# Patient Record
Sex: Male | Born: 1990 | Race: White | Hispanic: No | Marital: Married | Smoking: Never smoker
Health system: Southern US, Community
[De-identification: ages and names within clinical notes are randomized; demographics above are authoritative.]

---

## 2014-09-03 ENCOUNTER — Ambulatory Visit
Admission: RE | Admit: 2014-09-03 | Discharge: 2014-09-03 | Disposition: A | Discharge status: Home / Self Care | Payer: No Typology Code available for payment source | Source: Ambulatory Visit | Attending: Occupational Medicine | Admitting: Occupational Medicine

## 2014-09-03 ENCOUNTER — Other Ambulatory Visit: Payer: Self-pay | Admitting: Occupational Medicine

## 2014-09-03 DIAGNOSIS — Z021 Encounter for pre-employment examination: Secondary | ICD-10-CM

## 2016-06-08 ENCOUNTER — Other Ambulatory Visit: Payer: Self-pay | Admitting: Family Medicine

## 2016-06-09 LAB — HEPATITIS B SURFACE ANTIBODY,QUALITATIVE: Hep B Surface Ab, Qual: REACTIVE

## 2016-06-09 LAB — CMP12+LP+TP+TSH+6AC+CBC/D/PLT
ALBUMIN: 4.6 g/dL (ref 3.5–5.5)
ALK PHOS: 89 IU/L (ref 39–117)
ALT: 40 IU/L (ref 0–44)
AST: 37 IU/L (ref 0–40)
Albumin/Globulin Ratio: 1.8 (ref 1.2–2.2)
BASOS: 0 %
BILIRUBIN TOTAL: 0.7 mg/dL (ref 0.0–1.2)
BUN / CREAT RATIO: 16 (ref 9–20)
BUN: 15 mg/dL (ref 6–20)
Basophils Absolute: 0 10*3/uL (ref 0.0–0.2)
CALCIUM: 9.7 mg/dL (ref 8.7–10.2)
CHLORIDE: 99 mmol/L (ref 96–106)
Chol/HDL Ratio: 3.9 ratio units (ref 0.0–5.0)
Cholesterol, Total: 145 mg/dL (ref 100–199)
Creatinine, Ser: 0.93 mg/dL (ref 0.76–1.27)
EOS (ABSOLUTE): 0.1 10*3/uL (ref 0.0–0.4)
EOS: 2 %
Estimated CHD Risk: 0.7 times avg. (ref 0.0–1.0)
Free Thyroxine Index: 1.9 (ref 1.2–4.9)
GFR calc Af Amer: 132 mL/min/{1.73_m2} (ref >59)
GFR calc non Af Amer: 114 mL/min/{1.73_m2} (ref >59)
GGT: 46 IU/L (ref 0–65)
GLUCOSE: 69 mg/dL (ref 65–99)
Globulin, Total: 2.5 g/dL (ref 1.5–4.5)
HDL: 37 mg/dL — ABNORMAL LOW (ref >39)
HEMATOCRIT: 46.5 % (ref 37.5–51.0)
HEMOGLOBIN: 15.5 g/dL (ref 12.6–17.7)
IMMATURE GRANS (ABS): 0 10*3/uL (ref 0.0–0.1)
IMMATURE GRANULOCYTES: 0 %
Iron: 150 ug/dL (ref 38–169)
LDH: 158 IU/L (ref 121–224)
LDL CALC: 63 mg/dL (ref 0–99)
LYMPHS ABS: 1.7 10*3/uL (ref 0.7–3.1)
Lymphs: 33 %
MCH: 31 pg (ref 26.6–33.0)
MCHC: 33.3 g/dL (ref 31.5–35.7)
MCV: 93 fL (ref 79–97)
Monocytes Absolute: 0.4 10*3/uL (ref 0.1–0.9)
Monocytes: 7 %
NEUTROS ABS: 3 10*3/uL (ref 1.4–7.0)
NEUTROS PCT: 58 %
POTASSIUM: 4.5 mmol/L (ref 3.5–5.2)
Phosphorus: 3.1 mg/dL (ref 2.5–4.5)
Platelets: 229 10*3/uL (ref 150–379)
RBC: 5 x10E6/uL (ref 4.14–5.80)
RDW: 14 % (ref 12.3–15.4)
Sodium: 142 mmol/L (ref 134–144)
T3 Uptake Ratio: 31 % (ref 24–39)
T4, Total: 6 ug/dL (ref 4.5–12.0)
TSH: 1.1 u[IU]/mL (ref 0.450–4.500)
Total Protein: 7.1 g/dL (ref 6.0–8.5)
Triglycerides: 223 mg/dL — ABNORMAL HIGH (ref 0–149)
Uric Acid: 5.4 mg/dL (ref 3.7–8.6)
VLDL CHOLESTEROL CAL: 45 mg/dL — AB (ref 5–40)
WBC: 5.2 10*3/uL (ref 3.4–10.8)

## 2016-10-11 ENCOUNTER — Encounter: Payer: Self-pay | Admitting: Physician Assistant

## 2016-10-11 ENCOUNTER — Ambulatory Visit: Payer: Self-pay | Admitting: Physician Assistant

## 2016-10-11 VITALS — BP 140/67 | HR 97 | Temp 98.6°F

## 2016-10-11 DIAGNOSIS — R300 Dysuria: Secondary | ICD-10-CM

## 2016-10-11 DIAGNOSIS — Z299 Encounter for prophylactic measures, unspecified: Secondary | ICD-10-CM

## 2016-10-11 LAB — POCT URINALYSIS DIPSTICK
BILIRUBIN UA: NEGATIVE
GLUCOSE UA: NEGATIVE
Ketones, UA: NEGATIVE
Nitrite, UA: NEGATIVE
PH UA: 5.5
RBC UA: NEGATIVE
Spec Grav, UA: >=1.03
UROBILINOGEN UA: 0.2

## 2016-10-11 NOTE — Progress Notes (Signed)
S: c/o dysuria and ?abd swelling in lower abs, ?if hernia, no pain with lifting or bm, had regular bm today, no blood in stool, no fever/chills, did have some dysuria, denies unprotected sex, states he is married and has no concerns of infidelity, denies penile discharge  O: vitals wnl, nad, abd soft nontender, no obvious swelling noted, no weakness in muscle noted, n/v intact, ua with trace leuks + protein  A: dysuria  P: urine culture

## 2016-10-12 LAB — URINE CULTURE: Organism ID, Bacteria: NO GROWTH

## 2016-10-14 ENCOUNTER — Other Ambulatory Visit: Payer: Self-pay | Admitting: Family Medicine

## 2016-10-14 DIAGNOSIS — R1032 Left lower quadrant pain: Secondary | ICD-10-CM

## 2016-10-15 ENCOUNTER — Ambulatory Visit
Admission: RE | Admit: 2016-10-15 | Discharge: 2016-10-15 | Disposition: A | Discharge status: Home / Self Care | Payer: Managed Care, Other (non HMO) | Source: Ambulatory Visit | Attending: Family Medicine | Admitting: Family Medicine

## 2016-10-15 DIAGNOSIS — R1032 Left lower quadrant pain: Secondary | ICD-10-CM | POA: Diagnosis not present

## 2017-02-17 ENCOUNTER — Ambulatory Visit: Payer: Self-pay | Admitting: Physician Assistant

## 2017-02-17 ENCOUNTER — Encounter: Payer: Self-pay | Admitting: Physician Assistant

## 2017-02-17 VITALS — BP 110/70 | HR 92 | Temp 98.7°F

## 2017-02-17 DIAGNOSIS — N433 Hydrocele, unspecified: Secondary | ICD-10-CM

## 2017-02-17 NOTE — Progress Notes (Signed)
   Subjective:testicular pain    Patient ID: Kyle Acevedo, male    DOB: 1991/09/05, 26 y.o.   MRN: [id number]  HPI Patient c/ inferior scrotum pain for 2 weeks. Seen by Urgent Care clinic and pending UltraSound exam next week. Request 2nd opinion. States swelling increased with sexual activity and physical training. Denies groin pain. No penile discharge.   Review of Systems    Negative except for compliant. Objective:   Physical Exam Distended vascular structure lower scrotum       Assessment & Plan:Hydrocele  Advised to follow up with schedule Ultra Sound exam for definitve diagnosis.

## 2017-02-26 ENCOUNTER — Emergency Department (HOSPITAL_COMMUNITY)
Admission: EM | Admit: 2017-02-26 | Discharge: 2017-02-26 | Disposition: A | Discharge status: Home / Self Care | Payer: Managed Care, Other (non HMO) | Attending: Emergency Medicine | Admitting: Emergency Medicine

## 2017-02-26 ENCOUNTER — Encounter (HOSPITAL_COMMUNITY): Payer: Self-pay | Admitting: Emergency Medicine

## 2017-02-26 DIAGNOSIS — E860 Dehydration: Secondary | ICD-10-CM | POA: Insufficient documentation

## 2017-02-26 DIAGNOSIS — R509 Fever, unspecified: Secondary | ICD-10-CM

## 2017-02-26 DIAGNOSIS — R112 Nausea with vomiting, unspecified: Secondary | ICD-10-CM

## 2017-02-26 DIAGNOSIS — R197 Diarrhea, unspecified: Secondary | ICD-10-CM

## 2017-02-26 LAB — COMPREHENSIVE METABOLIC PANEL
ALBUMIN: 4.6 g/dL (ref 3.5–5.0)
ALT: 63 U/L (ref 17–63)
AST: 59 U/L — ABNORMAL HIGH (ref 15–41)
Alkaline Phosphatase: 76 U/L (ref 38–126)
Anion gap: 7 (ref 5–15)
BUN: 21 mg/dL — AB (ref 6–20)
CHLORIDE: 106 mmol/L (ref 101–111)
CO2: 26 mmol/L (ref 22–32)
CREATININE: 1.14 mg/dL (ref 0.61–1.24)
Calcium: 9.4 mg/dL (ref 8.9–10.3)
GFR calc Af Amer: >60 mL/min (ref >60)
GFR calc non Af Amer: >60 mL/min (ref >60)
Glucose, Bld: 134 mg/dL — ABNORMAL HIGH (ref 65–99)
POTASSIUM: 4.8 mmol/L (ref 3.5–5.1)
SODIUM: 139 mmol/L (ref 135–145)
Total Bilirubin: 2 mg/dL — ABNORMAL HIGH (ref 0.3–1.2)
Total Protein: 7.3 g/dL (ref 6.5–8.1)

## 2017-02-26 LAB — CBC
HCT: 45.9 % (ref 39.0–52.0)
Hemoglobin: 16 g/dL (ref 13.0–17.0)
MCH: 31.5 pg (ref 26.0–34.0)
MCHC: 34.9 g/dL (ref 30.0–36.0)
MCV: 90.4 fL (ref 78.0–100.0)
PLATELETS: 181 10*3/uL (ref 150–400)
RBC: 5.08 MIL/uL (ref 4.22–5.81)
RDW: 12.1 % (ref 11.5–15.5)
WBC: 14.3 10*3/uL — AB (ref 4.0–10.5)

## 2017-02-26 MED ORDER — ONDANSETRON 8 MG PO TBDP: 8.0000 mg | ORAL_TABLET | Freq: Three times a day (TID) | ORAL | 0 refills | Status: DC | PRN

## 2017-02-26 MED ORDER — ONDANSETRON HCL 4 MG/2ML IJ SOLN
4.0000 mg | Freq: Once | INTRAMUSCULAR | Status: AC
  Administered 2017-02-26: 4 mg via INTRAVENOUS
  Filled 2017-02-26: qty 2

## 2017-02-26 MED ORDER — SODIUM CHLORIDE 0.9 % IV BOLUS (SEPSIS)
1000.0000 mL | Freq: Once | INTRAVENOUS | Status: AC
  Administered 2017-02-26: 1000 mL via INTRAVENOUS

## 2017-02-26 MED ORDER — ACETAMINOPHEN 500 MG PO TABS
1000.0000 mg | ORAL_TABLET | Freq: Once | ORAL | Status: AC
  Administered 2017-02-26: 1000 mg via ORAL
  Filled 2017-02-26: qty 2

## 2017-02-26 NOTE — ED Triage Notes (Signed)
Reports n/v/d since 0800 this morning.

## 2017-02-26 NOTE — ED Triage Notes (Signed)
N/V/D since this am at 0800 No physician No flu shot

## 2017-02-26 NOTE — ED Provider Notes (Signed)
[facility]PT Provider Note   CSN: [id number] Arrival date & time: 02/26/17  1144  By signing my name below, I, Majel Homer, attest that this documentation has been prepared under the direction and in the presence of Azalia Bilis, MD . Electronically Signed: Majel Homer, Scribe. 02/26/2017. 12:10 PM.  History   Chief Complaint Chief Complaint  Patient presents with  . Abdominal Pain   The history is provided by the patient. No language interpreter was used.   HPI Comments: Kyle Acevedo is a 26 y.o. male who presents to the Emergency Department complaining of persistent, nausea, vomiting, and diarrhea that began at ~8:00 AM this morning. Pt reports associated fever and chills. He notes he has not taken any medication PTA to improve his symptoms. Pt denies cough, congestion, sand sore throat.   History reviewed. No pertinent past medical history.  There are no active problems to display for this patient.  History reviewed. No pertinent surgical history.  Home Medications    Prior to Admission medications   Not on File    Family History History reviewed. No pertinent family history.  Social History Social History  Substance Use Topics  . Smoking status: Never Smoker  . Smokeless tobacco: Never Used  . Alcohol use No     Allergies   Patient has no known allergies.   Review of Systems Review of Systems  HENT: Negative for congestion and sore throat.   Respiratory: Negative for cough.   Gastrointestinal: Positive for diarrhea, nausea and vomiting.  All other systems reviewed and are negative.  Physical Exam Updated Vital Signs BP (!) 103/38 (BP Location: Left Arm)   Pulse 88   Temp 98.4 F (36.9 C) (Oral)   Resp 18   Ht 5\' 9"  (1.753 m)   Wt 195 lb (88.5 kg)   SpO2 100%   BMI 28.80 kg/m   Physical Exam  Constitutional: He is oriented to person, place, and time. He appears well-developed and well-nourished.  HENT:  Head: Normocephalic and atraumatic.    Eyes: EOM are normal.  Neck: Normal range of motion.  Cardiovascular: Normal rate, regular rhythm, normal heart sounds and intact distal pulses.   Pulmonary/Chest: Effort normal and breath sounds normal. No respiratory distress.  Abdominal: Soft. He exhibits no distension. There is no tenderness.  Musculoskeletal: Normal range of motion.  Neurological: He is alert and oriented to person, place, and time.  Skin: Skin is warm and dry.  Psychiatric: He has a normal mood and affect. Judgment normal.  Nursing note and vitals reviewed.  ED Treatments / Results  DIAGNOSTIC STUDIES:  Oxygen Saturation is 100% on RA, normal by my interpretation.    COORDINATION OF CARE:  12:21 PM Discussed treatment plan with pt at bedside and pt agreed to plan.  Labs (all labs ordered are listed, but only abnormal results are displayed) Labs Reviewed - No data to display  EKG  EKG Interpretation None       Radiology No results found.  Procedures Procedures (including critical care time)  Medications Ordered in ED Medications - No data to display  Initial Impression / Assessment and Plan / ED Course  I have reviewed the triage vital signs and the nursing notes.  Pertinent labs & imaging results that were available during my care of the patient were reviewed by me and considered in my medical decision making (see chart for details).     1:55 PM Patient feels much better this time.  Repeat abdominal exam without  tenderness.  Likely viral illness.  Feels better after fluids.  Discharge home in good condition.  Home with Zofran.  The patient understands to return to the ER for new or worsening symptoms  I personally performed the services described in this documentation, which was scribed in my presence. The recorded information has been reviewed and is accurate.      Final Clinical Impressions(s) / ED Diagnoses   Final diagnoses:  Nausea vomiting and diarrhea  Dehydration  Fever,  unspecified fever cause    New Prescriptions New Prescriptions   No medications on file     Azalia BilisKevin Darryll Raju, MD 02/26/17 1356

## 2017-02-26 NOTE — ED Notes (Signed)
Pt made aware to return if symptoms worsen or if any life threatening symptoms occur.   

## 2017-03-03 ENCOUNTER — Encounter: Payer: Self-pay | Admitting: Urology

## 2017-03-03 ENCOUNTER — Ambulatory Visit: Payer: Managed Care, Other (non HMO) | Admitting: Urology

## 2017-03-03 VITALS — BP 149/63 | HR 79 | Ht 69.0 in | Wt 194.4 lb

## 2017-03-03 DIAGNOSIS — I861 Scrotal varices: Secondary | ICD-10-CM | POA: Diagnosis not present

## 2017-03-03 DIAGNOSIS — N5082 Scrotal pain: Secondary | ICD-10-CM

## 2017-03-03 LAB — URINALYSIS, COMPLETE
BILIRUBIN UA: NEGATIVE
GLUCOSE, UA: NEGATIVE
KETONES UA: NEGATIVE
Leukocytes, UA: NEGATIVE
Nitrite, UA: NEGATIVE
PROTEIN UA: NEGATIVE
RBC, UA: NEGATIVE
Specific Gravity, UA: 1.015 (ref 1.005–1.030)
UUROB: 0.2 mg/dL (ref 0.2–1.0)
pH, UA: 5.5 (ref 5.0–7.5)

## 2017-03-03 NOTE — Progress Notes (Signed)
03/03/2017 2:25 PM   Kyle Acevedo 20-Nov-1991 [id number]  Referring provider: No referring provider defined for this encounter.  Chief Complaint  Patient presents with  . New Patient (Initial Visit)    Lump on Left Testicle referred by Urgent Care    HPI: Patient is an 26 year old Caucasian male who is referred from [facility] for a testicle mass.  A week ago he felt a mass in his left testicle.  He states it feels like a jumbled bunch of worms that he can separate the "worms."   He is not having any scrotal swelling or edema.  He is not having pain or redness in the scrotal area.    He is married and only sexually active with his wife.    He has not had any urinary symptoms.  He has not any difficulty with erections.    He has not had gross hematuria, dysuria or suprapubic pain.  He has not had fevers, chills, nausea or vomiting.  His UA is unremarkable.    PMH: No past medical history on file.  Surgical History: History reviewed. No pertinent surgical history.  Home Medications:  Allergies as of 03/03/2017   No Known Allergies     Medication List       Accurate as of 03/03/17  2:25 PM. Always use your most recent med list.          ondansetron 8 MG disintegrating tablet Commonly known as:  ZOFRAN ODT Take 1 tablet (8 mg total) by mouth every 8 (eight) hours as needed for nausea or vomiting.       Allergies: No Known Allergies  Family History: Family History  Problem Relation Age of Onset  . Prostate cancer Neg Hx   . Kidney cancer Neg Hx   . Bladder Cancer Neg Hx     Social History:  reports that he has never smoked. He has never used smokeless tobacco. He reports that he does not drink alcohol or use drugs.  ROS: UROLOGY Frequent Urination?: No Hard to postpone urination?: No Burning/pain with urination?: No Get up at night to urinate?: No Leakage of urine?: No Urine stream starts and stops?: No Trouble starting stream?: No Do you have to  strain to urinate?: No Blood in urine?: No Urinary tract infection?: No Sexually transmitted disease?: No Injury to kidneys or bladder?: No Painful intercourse?: No Weak stream?: No Erection problems?: No Penile pain?: No  Gastrointestinal Nausea?: No Vomiting?: No Indigestion/heartburn?: No Diarrhea?: No Constipation?: No  Constitutional Fever: No Night sweats?: No Weight loss?: No Fatigue?: No  Skin Skin rash/lesions?: No Itching?: No  Eyes Blurred vision?: No Double vision?: No  Ears/Nose/Throat Sore throat?: No Sinus problems?: No  Hematologic/Lymphatic Swollen glands?: No Easy bruising?: No  Cardiovascular Leg swelling?: No Chest pain?: No  Respiratory Cough?: No Shortness of breath?: No  Endocrine Excessive thirst?: No  Musculoskeletal Back pain?: No Joint pain?: No  Neurological Headaches?: No Dizziness?: No  Psychologic Depression?: No Anxiety?: No  Physical Exam: BP (!) 149/63   Pulse 79   Ht 5\' 9"  (1.753 m)   Wt 194 lb 6.4 oz (88.2 kg)   BMI 28.71 kg/m   Constitutional: Well nourished. Alert and oriented, No acute distress. HEENT: [place] AT, moist mucus membranes. Trachea midline, no masses. Cardiovascular: No clubbing, cyanosis, or edema. Respiratory: Normal respiratory effort, no increased work of breathing. GI: Abdomen is soft, non tender, non distended, no abdominal masses. Liver and spleen not palpable.  No hernias appreciated.  Stool sample for occult testing is not indicated.   GU: No CVA tenderness.  No bladder fullness or masses.  Patient with circumcised phallus.  Urethral meatus is patent.  No penile discharge. No penile lesions or rashes. Scrotum without lesions, cysts, rashes and/or edema.  Testicles are located scrotally bilaterally. No masses are appreciated in the testicles. Left and right epididymis are normal.  Small left varicocele is noted.   Rectal: Deferred. Skin: No rashes, bruises or suspicious  lesions. Lymph: No cervical or inguinal adenopathy. Neurologic: Grossly intact, no focal deficits, moving all 4 extremities. Psychiatric: Normal mood and affect.  Laboratory Data: Lab Results  Component Value Date   WBC 14.3 (H) 02/26/2017   HGB 16.0 02/26/2017   HCT 45.9 02/26/2017   MCV 90.4 02/26/2017   PLT 181 02/26/2017    Lab Results  Component Value Date   CREATININE 1.14 02/26/2017     Lab Results  Component Value Date   TSH 1.100 06/08/2016       Component Value Date/Time   CHOL 145 06/08/2016 0000   HDL 37 (L) 06/08/2016 0000   CHOLHDL 3.9 06/08/2016 0000   LDLCALC 63 06/08/2016 0000    Lab Results  Component Value Date   AST 59 (H) 02/26/2017   Lab Results  Component Value Date   ALT 63 02/26/2017     Urinalysis Unremarkable.  See EPIC   Assessment & Plan:    1. Left varicocele   - patient is married and I advised that varicoceles may affect fertility   - will obtain a scrotal ultrasound for confirmation  - will call with results  2. Scrotal pain  - see above  Return for I will call patient with results.  These notes generated with voice recognition software. I apologize for typographical errors.  Michiel CowboySHANNON Conleigh Heinlein, PA-C  [facility] [address], [address] [place], [place] [address] [phone]

## 2017-03-04 LAB — GC/CHLAMYDIA PROBE AMP
Chlamydia trachomatis, NAA: NEGATIVE
NEISSERIA GONORRHOEAE BY PCR: NEGATIVE

## 2017-03-06 LAB — CULTURE, URINE COMPREHENSIVE

## 2017-05-13 ENCOUNTER — Other Ambulatory Visit: Payer: Self-pay

## 2017-05-13 VITALS — BP 125/80 | HR 61 | Temp 98.5°F | Ht 69.0 in | Wt 197.0 lb

## 2017-05-13 DIAGNOSIS — Z0189 Encounter for other specified special examinations: Principal | ICD-10-CM

## 2017-05-13 DIAGNOSIS — Z008 Encounter for other general examination: Secondary | ICD-10-CM

## 2017-05-13 NOTE — Progress Notes (Signed)
Patient came in to have blood drawn for testing and [facility]. Patient is scheduled to come back on June 7th for his physical.

## 2017-05-14 LAB — HCV COMMENT:

## 2017-05-14 LAB — CMP12+LP+TP+TSH+6AC+PSA+CBC…
A/G RATIO: 2.1 (ref 1.2–2.2)
ALBUMIN: 4.7 g/dL (ref 3.5–5.5)
ALT: 44 IU/L (ref 0–44)
AST: 52 IU/L — ABNORMAL HIGH (ref 0–40)
Alkaline Phosphatase: 97 IU/L (ref 39–117)
BUN/Creatinine Ratio: 17 (ref 9–20)
BUN: 20 mg/dL (ref 6–20)
Basophils Absolute: 0 10*3/uL (ref 0.0–0.2)
Basos: 0 %
Bilirubin Total: 1.2 mg/dL (ref 0.0–1.2)
CHOLESTEROL TOTAL: 125 mg/dL (ref 100–199)
CREATININE: 1.16 mg/dL (ref 0.76–1.27)
Calcium: 10 mg/dL (ref 8.7–10.2)
Chloride: 101 mmol/L (ref 96–106)
Chol/HDL Ratio: 3.3 ratio (ref 0.0–5.0)
EOS (ABSOLUTE): 0.4 10*3/uL (ref 0.0–0.4)
Eos: 4 %
Estimated CHD Risk: <0.5 times avg. (ref 0.0–1.0)
Free Thyroxine Index: 2.1 (ref 1.2–4.9)
GFR calc Af Amer: 101 mL/min/{1.73_m2} (ref >59)
GFR, EST NON AFRICAN AMERICAN: 87 mL/min/{1.73_m2} (ref >59)
GGT: 18 IU/L (ref 0–65)
GLOBULIN, TOTAL: 2.2 g/dL (ref 1.5–4.5)
Glucose: 87 mg/dL (ref 65–99)
HDL: 38 mg/dL — ABNORMAL LOW (ref >39)
Hematocrit: 43.1 % (ref 37.5–51.0)
Hemoglobin: 14.5 g/dL (ref 13.0–17.7)
IRON: 131 ug/dL (ref 38–169)
Immature Grans (Abs): 0 10*3/uL (ref 0.0–0.1)
Immature Granulocytes: 0 %
LDH: 229 IU/L — ABNORMAL HIGH (ref 121–224)
LDL Calculated: 65 mg/dL (ref 0–99)
LYMPHS: 23 %
Lymphocytes Absolute: 2 10*3/uL (ref 0.7–3.1)
MCH: 30.7 pg (ref 26.6–33.0)
MCHC: 33.6 g/dL (ref 31.5–35.7)
MCV: 91 fL (ref 79–97)
MONOS ABS: 0.5 10*3/uL (ref 0.1–0.9)
Monocytes: 5 %
NEUTROS ABS: 6.1 10*3/uL (ref 1.4–7.0)
Neutrophils: 68 %
PHOSPHORUS: 3.4 mg/dL (ref 2.5–4.5)
POTASSIUM: 4.7 mmol/L (ref 3.5–5.2)
Platelets: 264 10*3/uL (ref 150–379)
Prostate Specific Ag, Serum: 0.3 ng/mL (ref 0.0–4.0)
RBC: 4.72 x10E6/uL (ref 4.14–5.80)
RDW: 13.6 % (ref 12.3–15.4)
Sodium: 142 mmol/L (ref 134–144)
T3 UPTAKE RATIO: 28 % (ref 24–39)
T4 TOTAL: 7.6 ug/dL (ref 4.5–12.0)
TOTAL PROTEIN: 6.9 g/dL (ref 6.0–8.5)
TSH: 1.64 u[IU]/mL (ref 0.450–4.500)
Triglycerides: 109 mg/dL (ref 0–149)
Uric Acid: 5.5 mg/dL (ref 3.7–8.6)
VLDL Cholesterol Cal: 22 mg/dL (ref 5–40)
WBC: 9 10*3/uL (ref 3.4–10.8)

## 2017-05-14 LAB — TESTOSTERONE: Testosterone: 431 ng/dL (ref 264–916)

## 2017-05-14 LAB — HIV ANTIBODY (ROUTINE TESTING W REFLEX): HIV SCREEN 4TH GENERATION: NONREACTIVE

## 2017-05-14 LAB — HEPATITIS C ANTIBODY (REFLEX): HCV Ab: <0.1 s/co ratio (ref 0.0–0.9)

## 2017-05-14 LAB — VITAMIN D 25 HYDROXY (VIT D DEFICIENCY, FRACTURES): Vit D, 25-Hydroxy: 52.2 ng/mL (ref 30.0–100.0)

## 2017-05-26 ENCOUNTER — Encounter: Payer: Self-pay | Admitting: Physician Assistant

## 2017-05-26 ENCOUNTER — Ambulatory Visit: Payer: Self-pay | Admitting: Physician Assistant

## 2017-05-26 VITALS — BP 110/70 | HR 86 | Temp 98.5°F | Resp 16 | Ht 69.0 in | Wt 197.0 lb

## 2017-05-26 DIAGNOSIS — Z0000 Encounter for general adult medical examination without abnormal findings: Secondary | ICD-10-CM

## 2017-05-26 NOTE — Progress Notes (Signed)
   Subjective:Physical Exam    Patient ID: Kyle Acevedo, male    DOB: 08/24/1991, 26 y.o.   MRN: [id number]  HPI Present for annual physical. Voices no compliant.   Review of Systems Negative    Objective:   Physical Exam HEENT unremarkable. Neck supple w/o adenopathy. Lungs CTA and Heart RRR. Abdomen w/o HSM, normoactive BS, and SNTP. No spinal deformity. Free and equal range of motion of the cervical and lumbar spine. No upper/ lower extremity deformity. F/E ROM with strength 5/5. CNII-XII grossly intact.       Assessment & Plan:Well exam  Follow up as needed.

## 2017-06-10 ENCOUNTER — Other Ambulatory Visit: Payer: Self-pay

## 2018-05-16 ENCOUNTER — Encounter: Payer: Self-pay | Admitting: Urology

## 2018-09-26 ENCOUNTER — Encounter: Payer: Self-pay | Admitting: Emergency Medicine

## 2018-09-26 ENCOUNTER — Ambulatory Visit: Payer: Self-pay | Admitting: Emergency Medicine

## 2018-09-26 VITALS — BP 122/74 | HR 72 | Temp 98.4°F | Resp 12 | Ht 70.0 in | Wt 198.0 lb

## 2018-09-26 DIAGNOSIS — S76212A Strain of adductor muscle, fascia and tendon of left thigh, initial encounter: Secondary | ICD-10-CM

## 2018-09-26 DIAGNOSIS — R1032 Left lower quadrant pain: Secondary | ICD-10-CM

## 2018-09-26 NOTE — Progress Notes (Signed)
   Subjective:    Patient ID: Kyle Acevedo, male    DOB: 1991/04/08, 27 y.o.   MRN: [id number]  HPI Complains of "pulling feeling" left inguinal area for 4 days.  He feels it started with excessive weight lifting over the past 2 weeks.  The pulling feeling is mild and he does not characterize it as pain.  Has not noted any deformity or bulge.  Never had a hernia, request to be examined.  I note in chart that he had some similar symptoms in 2017 with normal ultrasound of left inguinal area 10/05/2016.  Also, saw a urologist at [facility]rology 2018 without any pathologic diagnosis. He denies any other abdominal or GU symptoms and otherwise feels well. Associated symptoms, no fever or chills or abdominal pain or nausea or vomiting or diarrhea or melena or blood in stool or change in bowel habits.  Denies dysuria urinary frequency or hematuria testicular pain or testicular mass.  Denies urethral discharge.  Review of Systems See above.  Review of systems otherwise negative    Objective:   Physical Exam  Pleasant, muscular male no distress.  BP 122/74.  Pulse 72.  Temp 98.4, respirations 12 Lungs: Equal expansion Abdomen: Soft, nontender, no organomegaly or hernia or mass. GU: He points to the left inguinal area where he has had the pulling sensation.  No deformity or tenderness or hernia or bulge seen or felt.  Testicles smooth without nodularity or abnormality.  No scrotal masses felt.  Inguinal canals normal without any hernias.  Exam was nontender.  No lesions seen or felt. Extremities: Within normal limits      Assessment & Plan:  Likely has left inguinal muscle strain without any clinical evidence of hernia on exam today. We discussed at length today.  Options discussed.  He declined ordering any imaging at this time.  He will decrease weight lifting and crosstraining for the next see if this resolves.  Discussed other measures such as heat and massage.   If not better within the next  couple weeks, recheck, or I gave him the option of calling us and we can order an ultrasound of the inguinal GU area. Precautions discussed. Red flags discussed. Questions invited and answered. Patient voiced understanding and agreement.

## 2018-10-03 ENCOUNTER — Other Ambulatory Visit: Payer: Self-pay

## 2018-10-03 ENCOUNTER — Other Ambulatory Visit: Payer: Self-pay | Admitting: Emergency Medicine

## 2018-10-03 ENCOUNTER — Telehealth: Payer: Self-pay

## 2018-10-03 DIAGNOSIS — R1032 Left lower quadrant pain: Secondary | ICD-10-CM

## 2018-10-03 NOTE — Telephone Encounter (Addendum)
Yes, per discussion with patient at office visit here 09/26/2018, please schedule ultrasound left inguinal area for persistent left inguinal pain.-We called and scheduled appointment for ultrasound 10/10/2018, at 3 PM.

## 2018-10-03 NOTE — Addendum Note (Signed)
Addended by: Dollene Primrose on: 10/03/2018 02:21 PM   Modules accepted: Orders

## 2018-10-05 ENCOUNTER — Other Ambulatory Visit: Payer: Self-pay | Admitting: Physician Assistant

## 2018-10-05 DIAGNOSIS — N50819 Testicular pain, unspecified: Secondary | ICD-10-CM

## 2018-10-06 ENCOUNTER — Ambulatory Visit
Admission: RE | Admit: 2018-10-06 | Discharge: 2018-10-06 | Disposition: A | Discharge status: Home / Self Care | Payer: Managed Care, Other (non HMO) | Source: Ambulatory Visit | Attending: Physician Assistant | Admitting: Physician Assistant

## 2018-10-06 DIAGNOSIS — N5089 Other specified disorders of the male genital organs: Secondary | ICD-10-CM | POA: Insufficient documentation

## 2018-10-06 DIAGNOSIS — N390 Urinary tract infection, site not specified: Secondary | ICD-10-CM | POA: Insufficient documentation

## 2018-10-06 DIAGNOSIS — I861 Scrotal varices: Secondary | ICD-10-CM | POA: Insufficient documentation

## 2018-10-06 DIAGNOSIS — R1030 Lower abdominal pain, unspecified: Secondary | ICD-10-CM | POA: Diagnosis not present

## 2018-10-06 DIAGNOSIS — N50819 Testicular pain, unspecified: Secondary | ICD-10-CM

## 2018-10-09 ENCOUNTER — Ambulatory Visit: Payer: Managed Care, Other (non HMO)

## 2018-10-10 ENCOUNTER — Other Ambulatory Visit: Payer: Self-pay

## 2019-01-25 ENCOUNTER — Other Ambulatory Visit: Payer: Self-pay

## 2019-01-25 ENCOUNTER — Ambulatory Visit: Payer: Self-pay | Admitting: Emergency Medicine

## 2019-01-25 ENCOUNTER — Encounter: Payer: Self-pay | Admitting: Emergency Medicine

## 2019-01-25 VITALS — BP 118/70 | HR 62 | Temp 98.3°F | Resp 14

## 2019-01-25 DIAGNOSIS — S39011A Strain of muscle, fascia and tendon of abdomen, initial encounter: Secondary | ICD-10-CM

## 2019-01-25 DIAGNOSIS — S39013D Strain of muscle, fascia and tendon of pelvis, subsequent encounter: Secondary | ICD-10-CM

## 2019-01-25 NOTE — Progress Notes (Signed)
[facility]   Patient ID: Kyle Acevedo DOB: 28 y.o. MRN: [id number]   Subjective: HPI:  Vague complaints of 3 weeks of intermittent sharp right inguinal/right lower quadrant muscular wall pain worse with exercising.  No radiation.  He has had these symptoms recurrently over the years and was seen here with normal exam October 2019, with normal ultrasound scrotum with Doppler and normal soft tissue ultrasound of inguinal region 10/06/2018. Denies any GI symptoms whatsoever.  No other abdominal pain.  No nausea, vomiting, diarrhea, change of bowel habits, bright red blood per rectum, melena.  His symptoms are unaffected by eating.  Denies any urinary symptoms.  No dysuria or hematuria.  No GU sores or rash.  No fever or chills. Normal appetite.  No weight change.  Energy level good.  No chest pain or shortness of breath.  Pertinent items noted in HPI and remainder of comprehensive ROS otherwise negative.   Objective: Blood pressure 118/70, pulse 62, temperature 98.3 F (36.8 C), temperature source Oral, resp. rate 14, SpO2 98 %.  Alert, pleasant male no distress. Lungs clear.  Heart regular rhythm Back, nontender without deformity.  No CVA tenderness. Abdomen: Soft, nontender except minimal tenderness right inguinal and right lower quadrant abdominal wall muscles, worse with tensing these muscles or doing a sit up.  No guarding or rebound.  Bowel sounds are normal.     Palpation: There is no fluid wave, hepatomegaly, mass or pulsatile mass.     Tenderness: There is no right CVA tenderness, left CVA tenderness, guarding or rebound. Negative signs include Murphy's sign, Rovsing's sign, McBurney's sign, psoas sign and obturator sign.     Hernia: No hernia is present. There is no hernia in the ventral area, right inguinal area, left inguinal area, right femoral area or left femoral area.    GU: Testicles normal without masses or lesions or tenderness.   No swelling or erythema.  No skin lesions.  Penis normal.  No inguinal hernia.   Assessment: Recurrent muscular strain right lower abdomen without evidence of GU abnormality or hernia or any intra-abdominal cause. Has had negative imaging, see above in history.   Plan:  Treatment options discussed, as well as risks, benefits, alternatives. Patient voiced understanding and agreement with the following plans: Advised not to do aggressive exercising or weight lifting for now and to do very mild crosstraining. Heat alternate with ice. Ibuprofen OTC.  He declined any prescription NSAID. I explained to him at length that because this is a recurrent problem, he needs to address this further with his PCP. He stated he does not have a PCP and I urged him to schedule an appointment today to get established and for a complete wellness exam with a PCP.  I explained risks of him not doing so.  He voiced understanding.  Precautions discussed. Red flags discussed. Questions invited and answered. Patient voiced understanding and agreement with above.

## 2019-02-02 ENCOUNTER — Ambulatory Visit: Payer: Self-pay | Admitting: Adult Health

## 2019-02-02 VITALS — BP 110/60 | HR 66 | Resp 14 | Ht 69.0 in | Wt 197.0 lb

## 2019-02-02 DIAGNOSIS — N433 Hydrocele, unspecified: Secondary | ICD-10-CM | POA: Insufficient documentation

## 2019-02-02 DIAGNOSIS — Z008 Encounter for other general examination: Secondary | ICD-10-CM

## 2019-02-02 DIAGNOSIS — Z0189 Encounter for other specified special examinations: Principal | ICD-10-CM

## 2019-02-02 DIAGNOSIS — S39011A Strain of muscle, fascia and tendon of abdomen, initial encounter: Secondary | ICD-10-CM | POA: Insufficient documentation

## 2019-02-02 NOTE — Progress Notes (Signed)
[facility]  Subjective:     Patient ID: Kyle Acevedo, male   DOB: February 12, 1991, 28 y.o.   MRN: [id number]  HPI   Blood pressure 110/60, pulse 66, resp. rate 14, height 5\' 9"  (1.753 m), weight 197 lb (89.4 kg), SpO2 99 %.  Patient is a  28 year old male   in no acute distress who comes to the clinic for a biometric screening with brief biometric screening with labs include fasting glucose and lipids.    He reports his abdomen pain has resolved since seeing Dr. Georgina Pillion last in this clinic.   Patient  denies any fever, body aches,chills, rash, chest pain, shortness of breath, nausea, vomiting, or diarrhea.     No Known Allergies  .   Review of Systems  Constitutional: Negative.   HENT: Negative.   Eyes: Negative.   Respiratory: Negative.   Cardiovascular: Negative.   Gastrointestinal: Negative.   Endocrine: Negative.   Genitourinary: Negative.   Musculoskeletal: Negative.   Skin: Negative.   Allergic/Immunologic: Negative.   Neurological: Negative.   Hematological: Negative.   Psychiatric/Behavioral: Negative.        Objective:   Physical Exam Vitals signs reviewed.  Constitutional:      General: He is not in acute distress.    Appearance: Normal appearance. He is not ill-appearing, toxic-appearing or diaphoretic.     Comments: Patient is alert and oriented and responsive to questions Engages in eye contact with provider. Speaks in full sentences without any pauses without any shortness of breath or distress.    HENT:     Head: Normocephalic and atraumatic.     Right Ear: Tympanic membrane, ear canal and external ear normal. There is no impacted cerumen.     Left Ear: Tympanic membrane, ear canal and external ear normal. There is no impacted cerumen.     Nose: Nose normal.     Mouth/Throat:     Mouth: Mucous membranes are moist.     Pharynx: No oropharyngeal exudate or posterior oropharyngeal erythema.  Eyes:     General:  No scleral icterus.       Right eye: No discharge.        Left eye: No discharge.     Conjunctiva/sclera: Conjunctivae normal.     Pupils: Pupils are equal, round, and reactive to light.  Neck:     Musculoskeletal: Full passive range of motion without pain, normal range of motion and neck supple. No neck rigidity.     Vascular: No carotid bruit.     Trachea: Trachea and phonation normal.  Cardiovascular:     Rate and Rhythm: Normal rate and regular rhythm.     Heart sounds: Normal heart sounds. No murmur. No friction rub. No gallop.   Pulmonary:     Effort: Pulmonary effort is normal. No respiratory distress.     Breath sounds: Normal breath sounds. No stridor. No wheezing, rhonchi or rales.  Chest:     Chest wall: No tenderness.  Abdominal:     Palpations: Abdomen is soft.  Musculoskeletal: Normal range of motion.  Lymphadenopathy:     Cervical: No cervical adenopathy.  Skin:    General: Skin is warm and dry.     Capillary Refill: Capillary refill takes less than 2 seconds.  Neurological:     Mental Status: He is alert and oriented to person, place, and time.     Gait: Gait normal.     Comments: Patient moves on  and off of exam table and in room without difficulty. Gait is normal in hall and in room. Patient is oriented to person place time and situation. Patient answers questions appropriately and engages in conversation.   Psychiatric:        Mood and Affect: Mood normal.        Behavior: Behavior normal.        Thought Content: Thought content normal.        Judgment: Judgment normal.        Assessment:     Encounter for biometric screening - Plan: Glucose, Lipid Profile    Plan:     Please see your primary care provider for a yearly physical and labs.  I will have the office call you on your glucose and cholesterol results when they return if you have not heard within 1 week please call the office and will have you follow up with your primary care doctor for any  abnormal's. This biometric physical is not a substitute for seeing a primary care provider for a physical. Provider also recommends if you do not have a primary care provider for patient see a  primary care physician/ provider  for a routine physical and to establish primary care. Patient may chose provider of choice. Also gave the [place]  PHYSICIAN/PROVIDER  REFERRAL LINE at [phone] or web site at [email or web address] to help assist with finding a primary care doctor. Patient understands this office is acute care and no primary care is in this office.   Patient was also given primary care practices that are taking new patients in [place], form was given  that the office has to provide patients with a goal primary care provider.  He is advised that full set of labs is not done at this physical and that he needs to find a primary care physician for male health maintenance and routine labs as recommended.  Gave and reviewed After Visit Summary(AVS) with patient. Patient is advised to read the after visit summary as well and let the provider know if any question, concerns or clarifications are needed.    Follow up with primary care as needed for chronic and maintenance health care- can be seen in this employee clinic for acute care.

## 2019-02-02 NOTE — Patient Instructions (Signed)
Follow up with primary care as needed for chronic and maintenance health care- can be seen in this employee clinic for acute care.  Please see your primary care provider for a yearly physical and labs.  I will have the office call you on your glucose and cholesterol results when they return if you have not heard within 1 week please call the office and will have you follow up with your primary care doctor for any abnormal's. This biometric physical is not a substitute for seeing a primary care provider for a physical. Provider also recommends if you do not have a primary care provider for patient see a  primary care physician/ provider  for a routine physical and to establish primary care. Patient may chose provider of choice. Also gave the [facility]  PHYSICIAN/PROVIDER  REFERRAL LINE at [phone] or web site at [email or web address] to help assist with finding a primary care doctor. Patient understands this office is acute care and no primary care is in this office.   Health Maintenance, Male A healthy lifestyle and preventive care is important for your health and wellness. Ask your health care provider about what schedule of regular examinations is right for you. What should I know about weight and diet? Eat a Healthy Diet  Eat plenty of vegetables, fruits, whole grains, low-fat dairy products, and lean protein.  Do not eat a lot of foods high in solid fats, added sugars, or salt.  Maintain a Healthy Weight Regular exercise can help you achieve or maintain a healthy weight. You should:  Do at least 150 minutes of exercise each week. The exercise should increase your heart rate and make you sweat (moderate-intensity exercise).  Do strength-training exercises at least twice a week. Watch Your Levels of Cholesterol and Blood Lipids  Have your blood tested for lipids and cholesterol every 5 years starting at 27 years of age. If you are at high risk for heart disease, you should start having  your blood tested when you are 27 years old. You may need to have your cholesterol levels checked more often if: ? Your lipid or cholesterol levels are high. ? You are older than 27 years of age. ? You are at high risk for heart disease. What should I know about cancer screening? Many types of cancers can be detected early and may often be prevented. Lung Cancer  You should be screened every year for lung cancer if: ? You are a current smoker who has smoked for at least 30 years. ? You are a former smoker who has quit within the past 15 years.  Talk to your health care provider about your screening options, when you should start screening, and how often you should be screened. Colorectal Cancer  Routine colorectal cancer screening usually begins at 27 years of age and should be repeated every 5-10 years until you are 27 years old. You may need to be screened more often if early forms of precancerous polyps or small growths are found. Your health care provider may recommend screening at an earlier age if you have risk factors for colon cancer.  Your health care provider may recommend using home test kits to check for hidden blood in the stool.  A small camera at the end of a tube can be used to examine your colon (sigmoidoscopy or colonoscopy). This checks for the earliest forms of colorectal cancer. Prostate and Testicular Cancer  Depending on your age and overall health, your health care  provider may do certain tests to screen for prostate and testicular cancer.  Talk to your health care provider about any symptoms or concerns you have about testicular or prostate cancer. Skin Cancer  Check your skin from head to toe regularly.  Tell your health care provider about any new moles or changes in moles, especially if: ? There is a change in a mole's size, shape, or color. ? You have a mole that is larger than a pencil eraser.  Always use sunscreen. Apply sunscreen liberally and repeat  throughout the day.  Protect yourself by wearing long sleeves, pants, a wide-brimmed hat, and sunglasses when outside. What should I know about heart disease, diabetes, and high blood pressure?  If you are 18-39 years of age, have your blood pressure checked every 3-5 years. If you are 40 years of age or older, have your blood pressure checked every year. You should have your blood pressure measured twice-once when you are at a hospital or clinic, and once when you are not at a hospital or clinic. Record the average of the two measurements. To check your blood pressure when you are not at a hospital or clinic, you can use: ? An automated blood pressure machine at a pharmacy. ? A home blood pressure monitor.  Talk to your health care provider about your target blood pressure.  If you are between 45-79 years old, ask your health care provider if you should take aspirin to prevent heart disease.  Have regular diabetes screenings by checking your fasting blood sugar level. ? If you are at a normal weight and have a low risk for diabetes, have this test once every three years after the age of 45. ? If you are overweight and have a high risk for diabetes, consider being tested at a younger age or more often.  A one-time screening for abdominal aortic aneurysm (AAA) by ultrasound is recommended for men aged 65-75 years who are current or former smokers. What should I know about preventing infection? Hepatitis B If you have a higher risk for hepatitis B, you should be screened for this virus. Talk with your health care provider to find out if you are at risk for hepatitis B infection. Hepatitis C Blood testing is recommended for:  Everyone born from 1945 through 1965.  Anyone with known risk factors for hepatitis C. Sexually Transmitted Diseases (STDs)  You should be screened each year for STDs including gonorrhea and chlamydia if: ? You are sexually active and are younger than 27 years of age.  ? You are older than 27 years of age and your health care provider tells you that you are at risk for this type of infection. ? Your sexual activity has changed since you were last screened and you are at an increased risk for chlamydia or gonorrhea. Ask your health care provider if you are at risk.  Talk with your health care provider about whether you are at high risk of being infected with HIV. Your health care provider may recommend a prescription medicine to help prevent HIV infection. What else can I do?  Schedule regular health, dental, and eye exams.  Stay current with your vaccines (immunizations).  Do not use any tobacco products, such as cigarettes, chewing tobacco, and e-cigarettes. If you need help quitting, ask your health care provider.  Limit alcohol intake to no more than 2 drinks per day. One drink equals 12 ounces of beer, 5 ounces of wine, or 1 ounces of hard   liquor.  Do not use street drugs.  Do not share needles.  Ask your health care provider for help if you need support or information about quitting drugs.  Tell your health care provider if you often feel depressed.  Tell your health care provider if you have ever been abused or do not feel safe at home. This information is not intended to replace advice given to you by your health care provider. Make sure you discuss any questions you have with your health care provider. Document Released: 06/03/2008 Document Revised: 08/04/2016 Document Reviewed: 09/09/2015 Elsevier Interactive Patient Education  2019 [employer]vier Inc.  

## 2019-02-03 LAB — LIPID PANEL
CHOLESTEROL TOTAL: 166 mg/dL (ref 100–199)
Chol/HDL Ratio: 3.7 ratio (ref 0.0–5.0)
HDL: 45 mg/dL (ref >39)
LDL Calculated: 109 mg/dL — ABNORMAL HIGH (ref 0–99)
TRIGLYCERIDES: 60 mg/dL (ref 0–149)
VLDL Cholesterol Cal: 12 mg/dL (ref 5–40)

## 2019-02-03 LAB — GLUCOSE, RANDOM: Glucose: 86 mg/dL (ref 65–99)

## 2019-02-05 NOTE — Progress Notes (Signed)
Released to [facility]  Patient has a normal fasting glucose.  Patient's LDL bad cholesterol is elevated mildly at 109.  Normal range for LDL is 0-99.  I will recommend patient following a low-fat, low-cholesterol diet and patient  following up with a primary care provider for further labs and routine health care as well as male health maintenance.

## 2019-08-22 IMAGING — US US SCROTUM W/ DOPPLER COMPLETE
1 series · 14 of 25 positions shown · non-contrast
Comparison: None.

CLINICAL DATA: Left inguinal pain and testicular pain.

EXAM:
SCROTAL ULTRASOUND
DOPPLER ULTRASOUND OF THE TESTICLES
TECHNIQUE: Complete ultrasound examination of the testicles, epididymis, and
other scrotal structures was performed. Color and spectral Doppler
ultrasound were also utilized to evaluate blood flow to the
testicles.

[Series 1: us scrotum w/ doppler complete · 0.07mm/px · 14 of 51 slices shown]
[im 1/51]
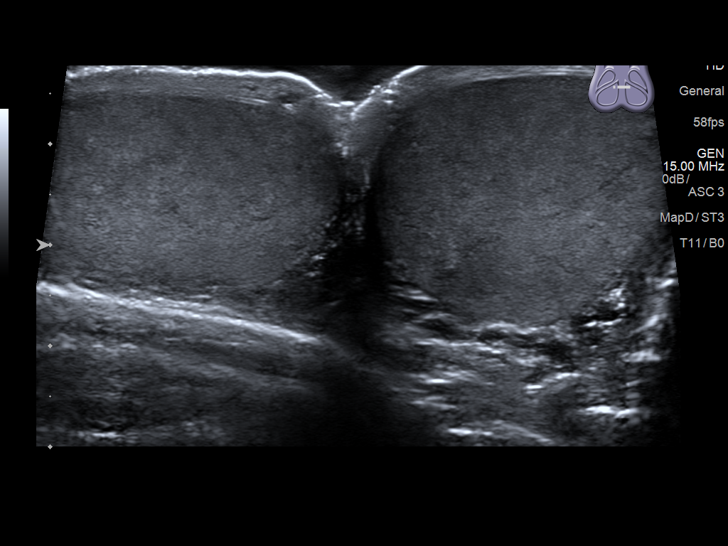
[im 5/51]
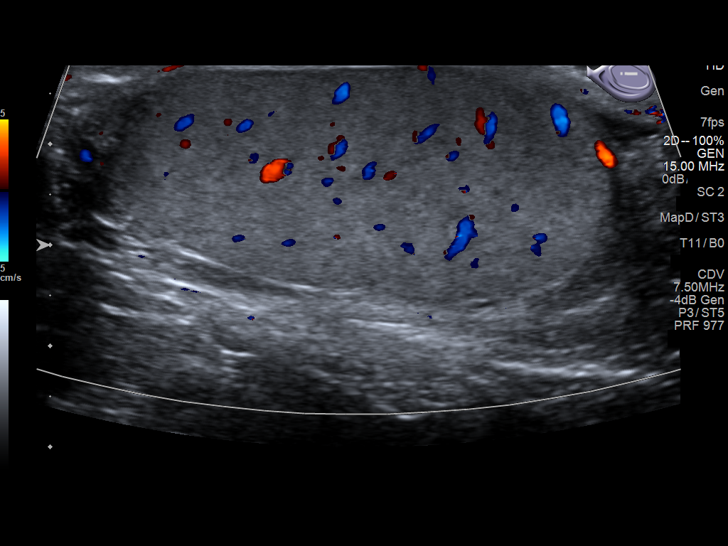
[im 9/51]
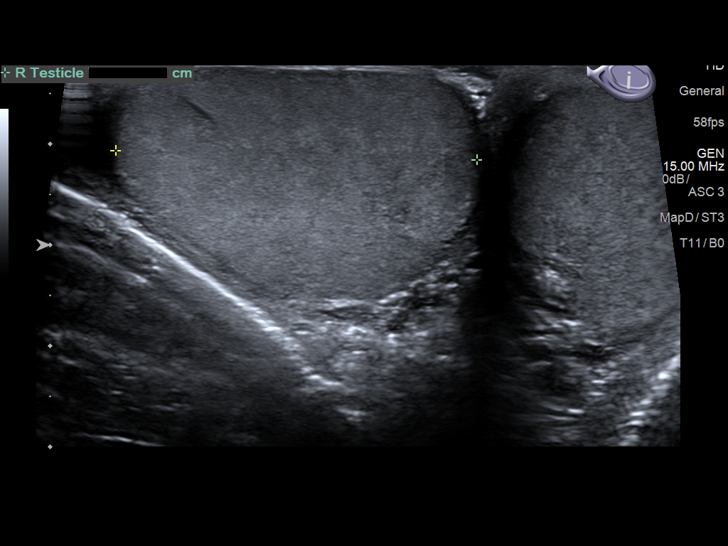
[im 13/51]
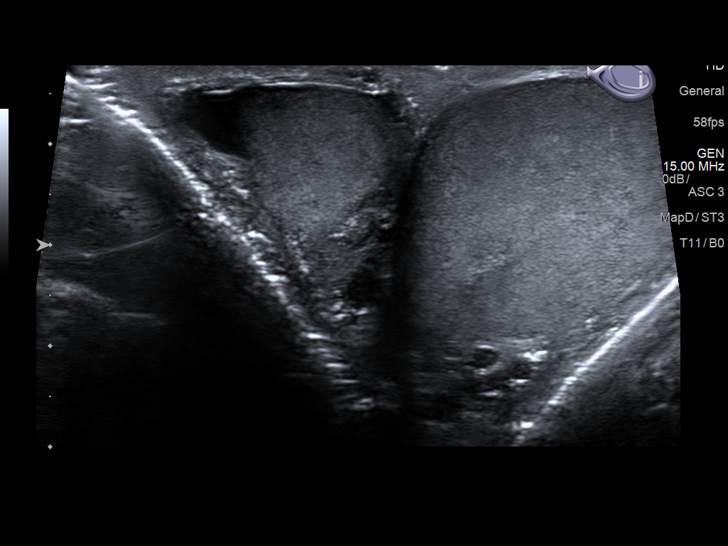
[im 17/51]
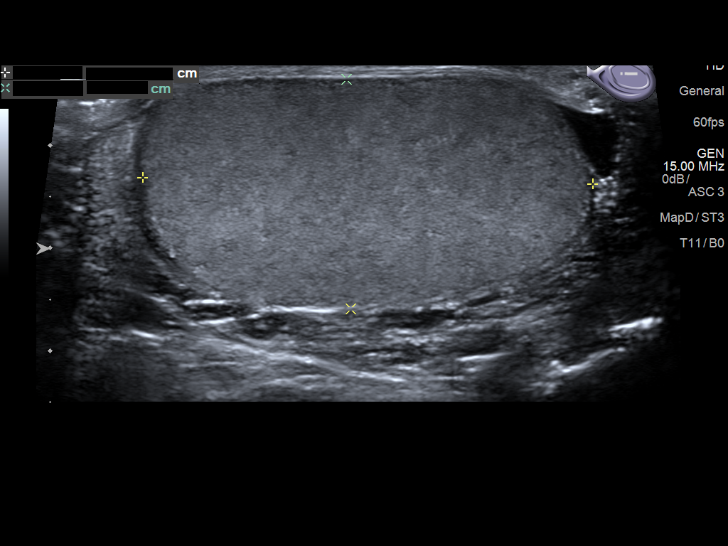
[im 19/51]
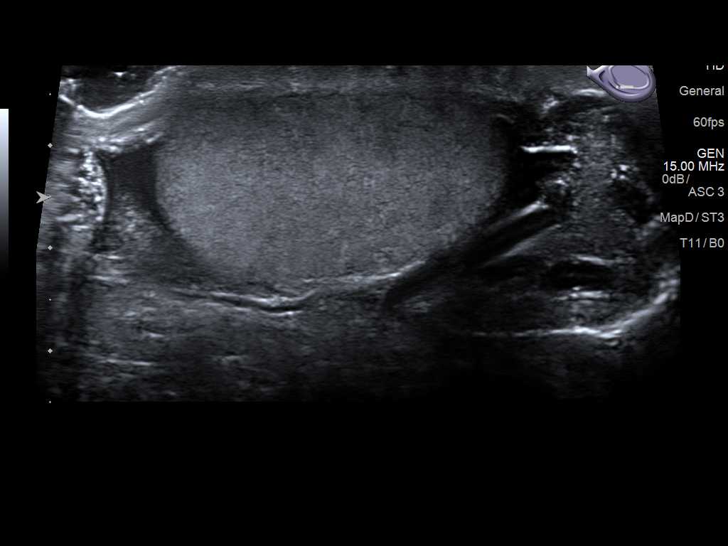
[im 23/51]
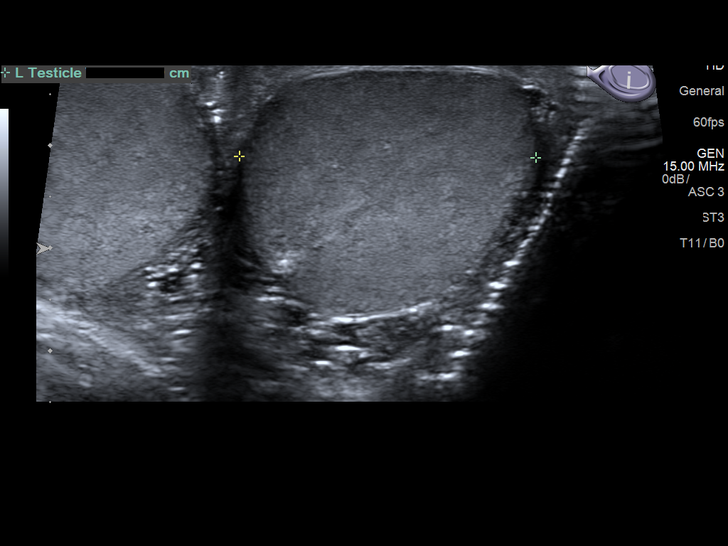
[im 28/51]
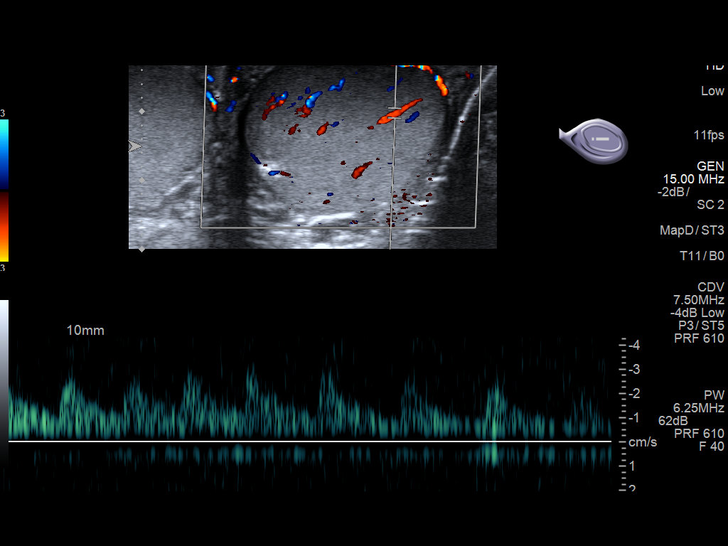
[im 32/51]
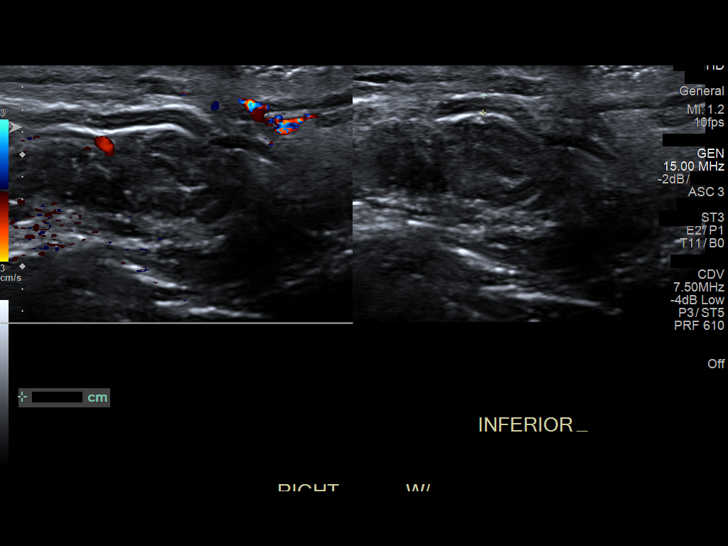
[im 34/51]
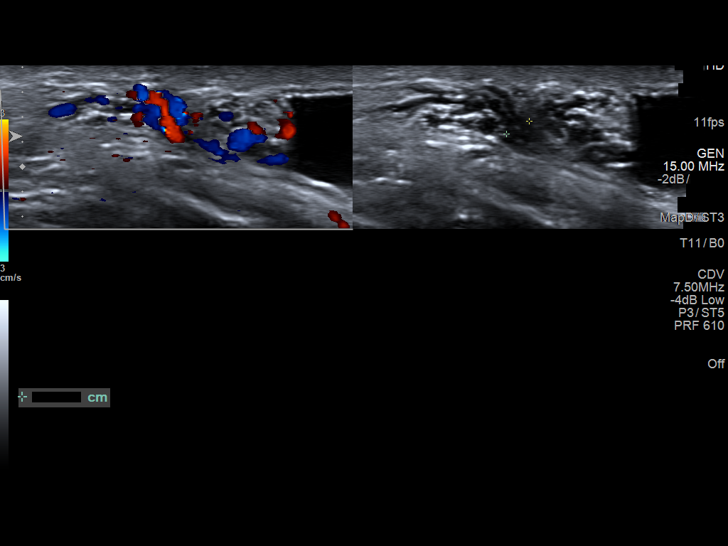
[im 38/51]
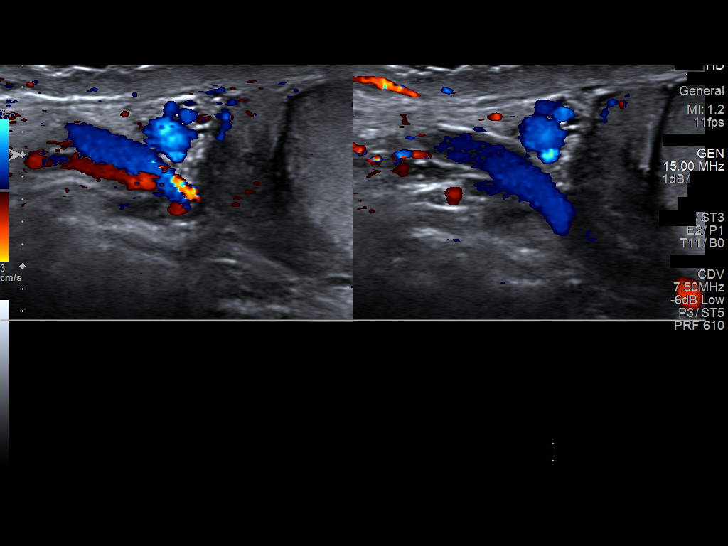
[im 42/51]
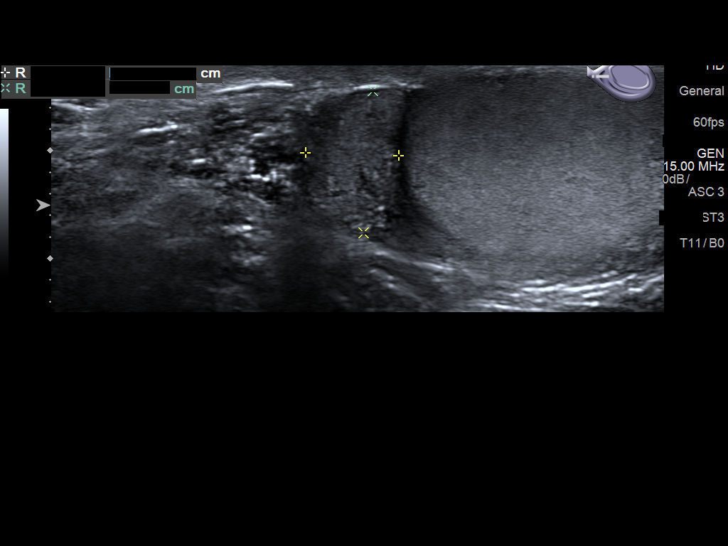
[im 46/51]
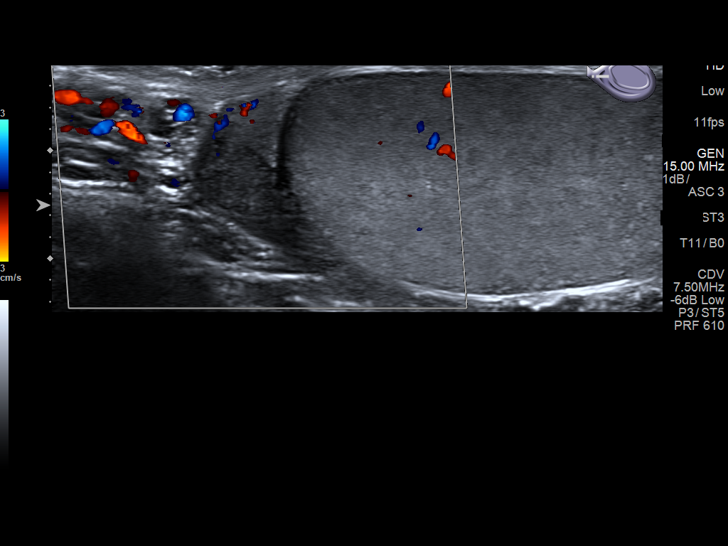
[im 51/51]
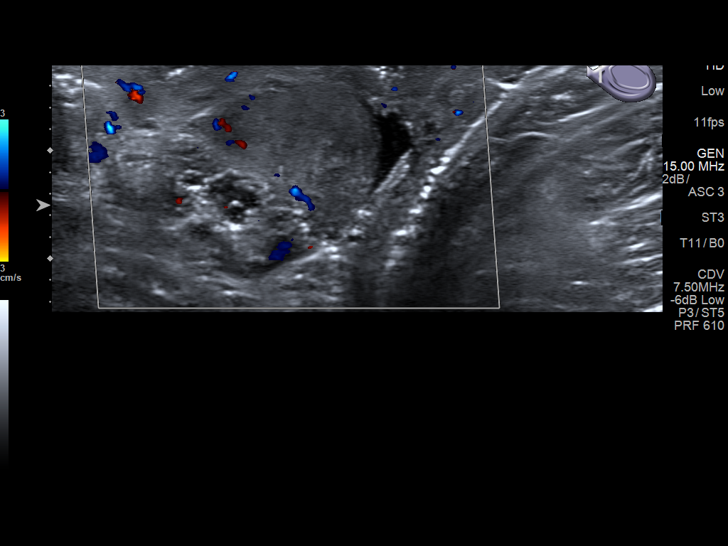

[14 of 25 positions shown; findings below may reference images not displayed]

FINDINGS: Right testicle

Measurements: 4.9 x 2.5 x 3.6 cm. No mass or microlithiasis
visualized.

Left testicle

Measurements: 4.4 x 2.2 x 2.9 cm. No mass or microlithiasis
visualized.

Right epididymis:  Normal in size and appearance.

Left epididymis:  Normal in size and appearance.

Hydrocele:  None visualized.

Varicocele:  Small left varicocele.

Pulsed Doppler interrogation of both testes demonstrates normal low
resistance arterial and venous waveforms bilaterally.
IMPRESSION: Normal appearance of the testicles.

Mild left varicocele.

## 2019-08-22 IMAGING — US US EXTREM LOW*L* LIMITED
1 series · 14 of 22 positions shown · non-contrast
Comparison: None.

CLINICAL DATA: Left inguinal pain.

EXAM:
ULTRASOUND LEFT LOWER EXTREMITY LIMITED
TECHNIQUE: Ultrasound examination of the lower extremity soft tissues was
performed in the area of clinical concern.

[Series 1: us extrem low*left* limited · 0.07mm/px · 22 acquisitions, 14 frames shown]
[im 1/22]
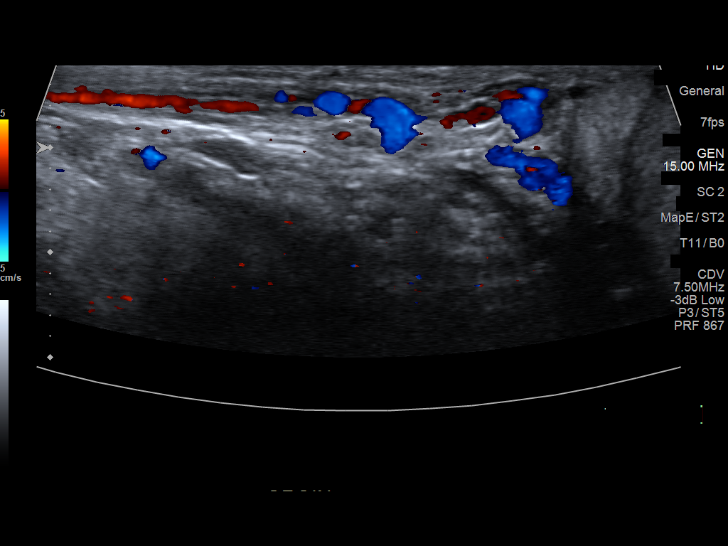
[im 3/22]
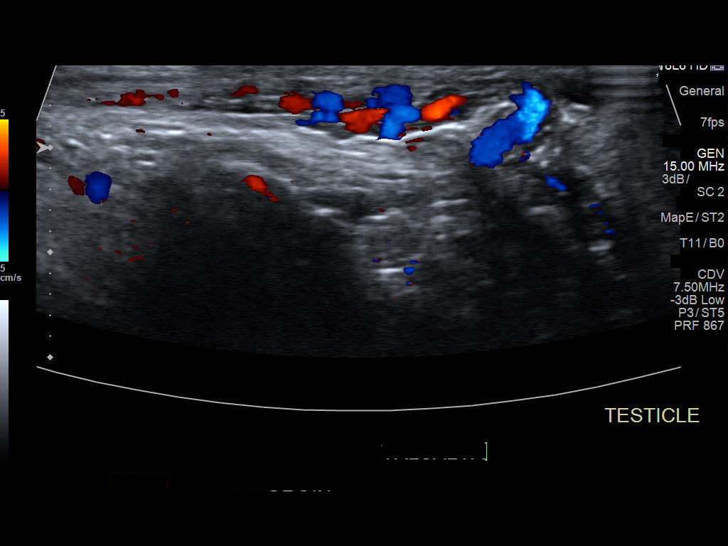
[im 4/22]
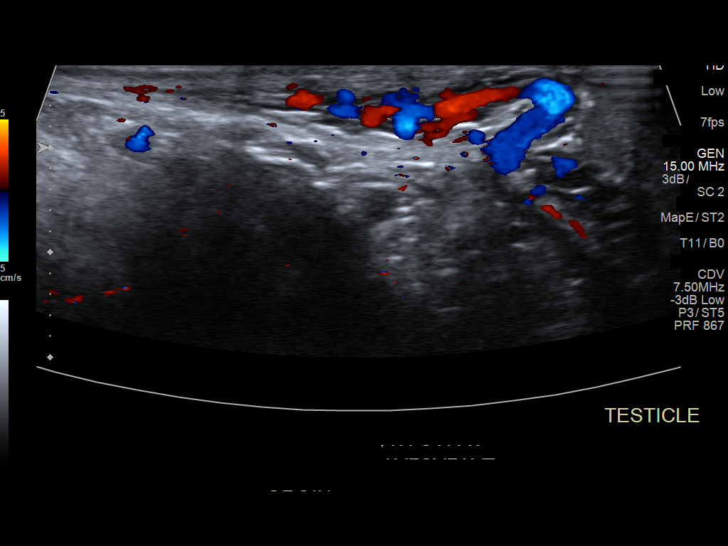
[im 6/22]
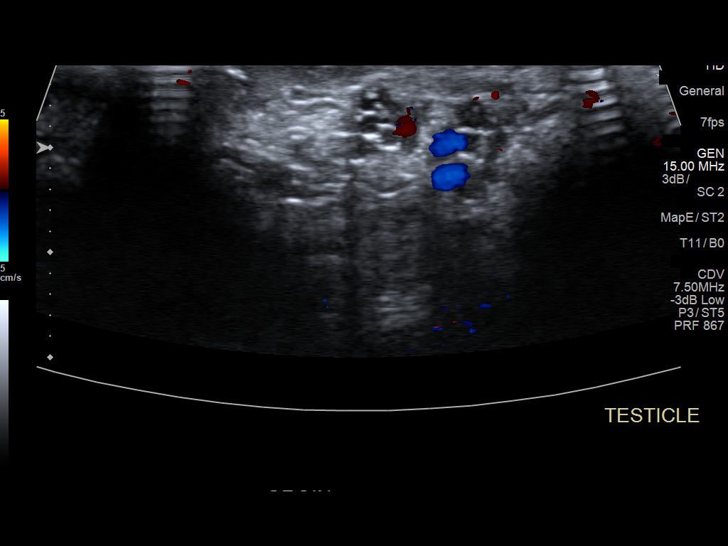
[im 8/22]
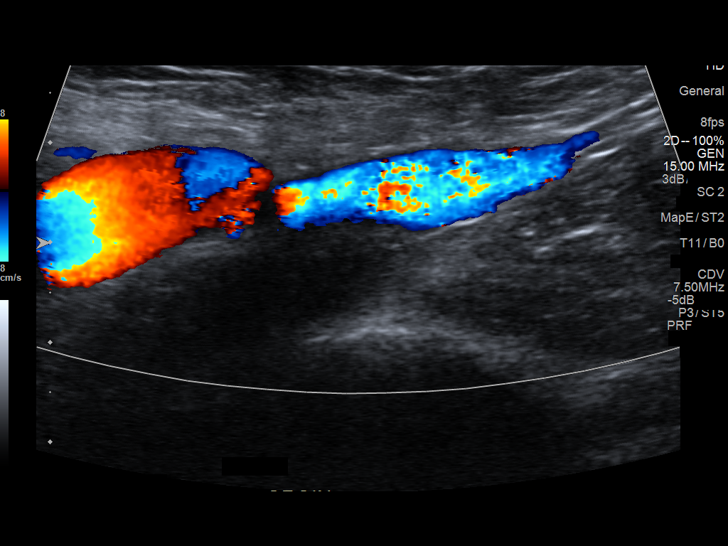
[im 9/22]
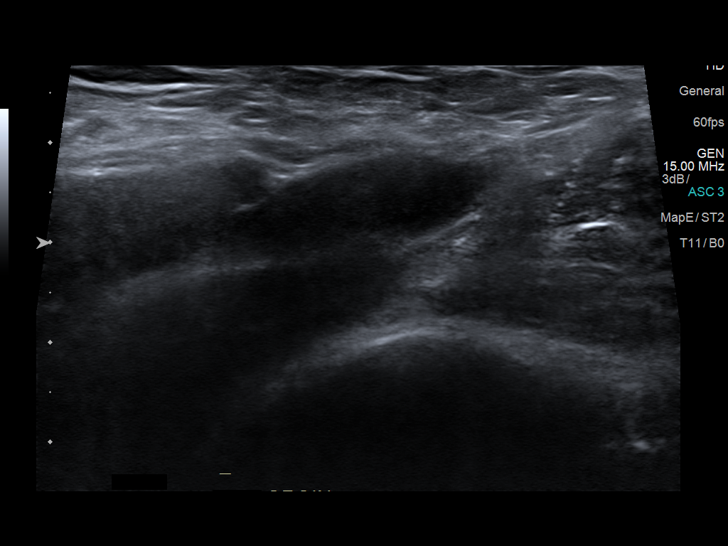
[im 11/22]
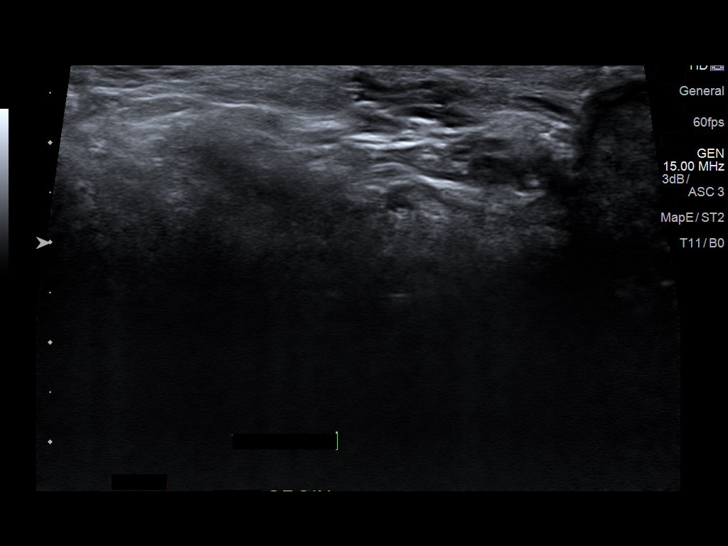
[im 12/22]
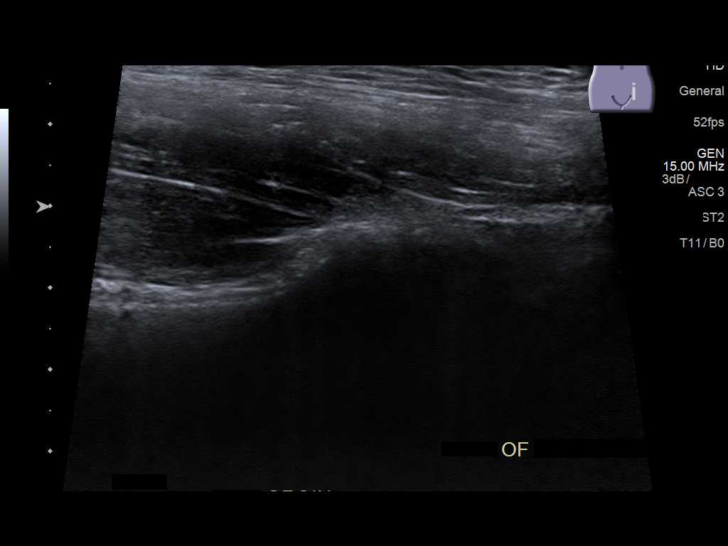
[im 14/22]
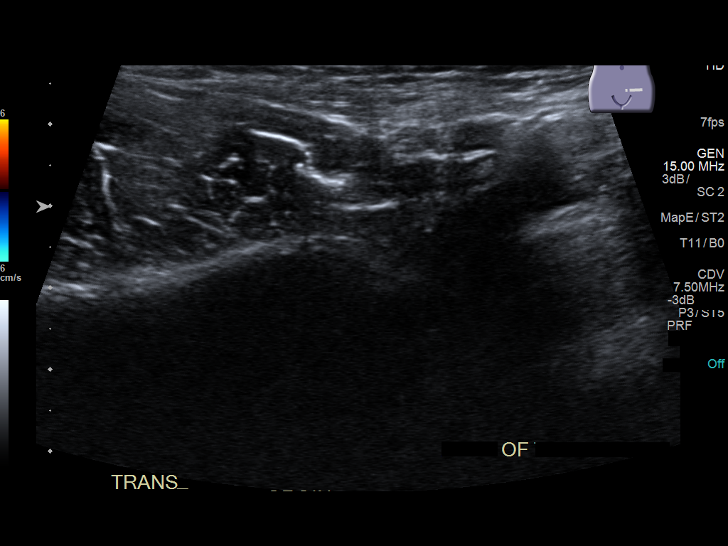
[im 15/22]
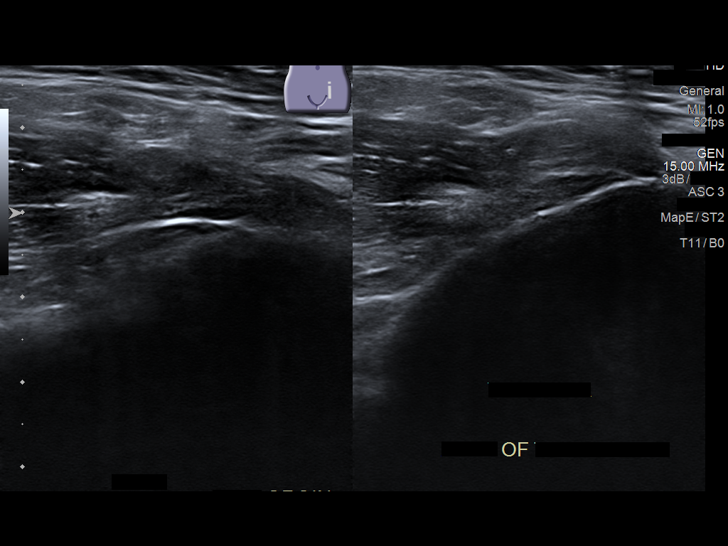
[im 17/22]
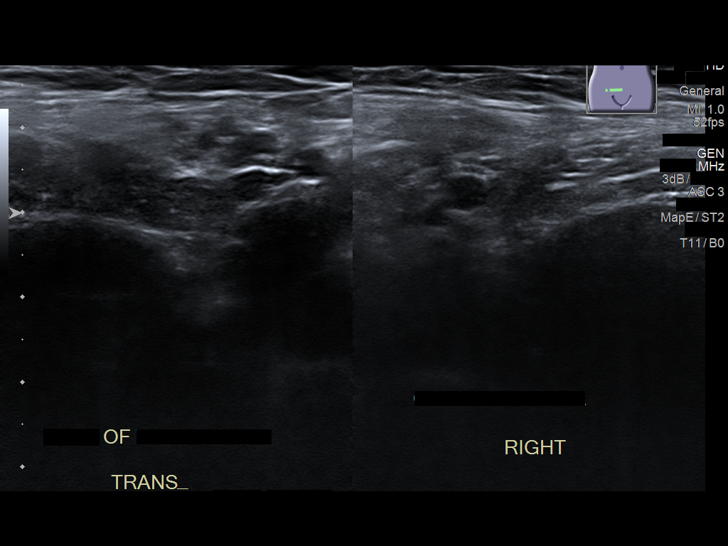
[im 19/22]
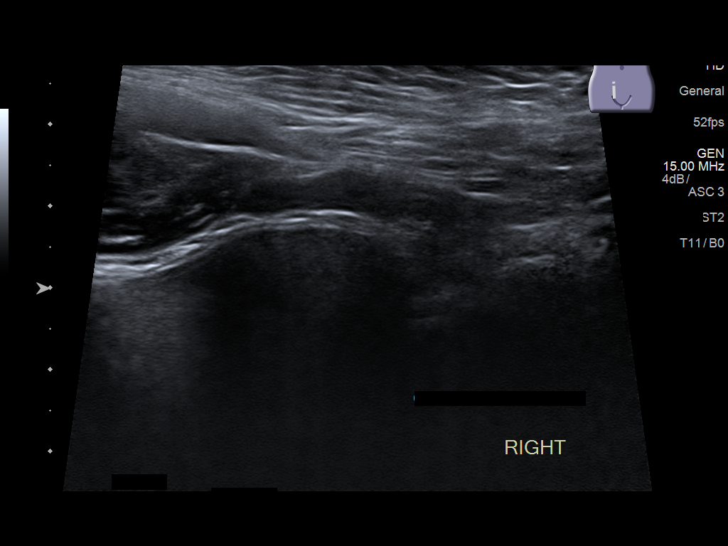
[im 20/22]
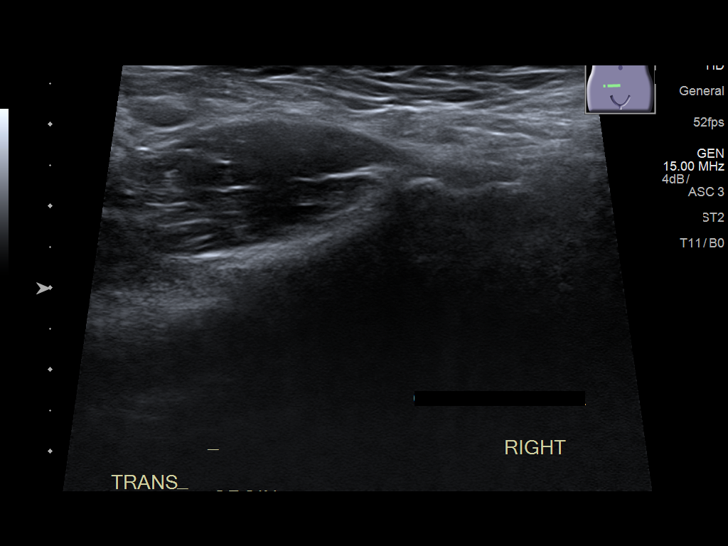
[im 22/22]
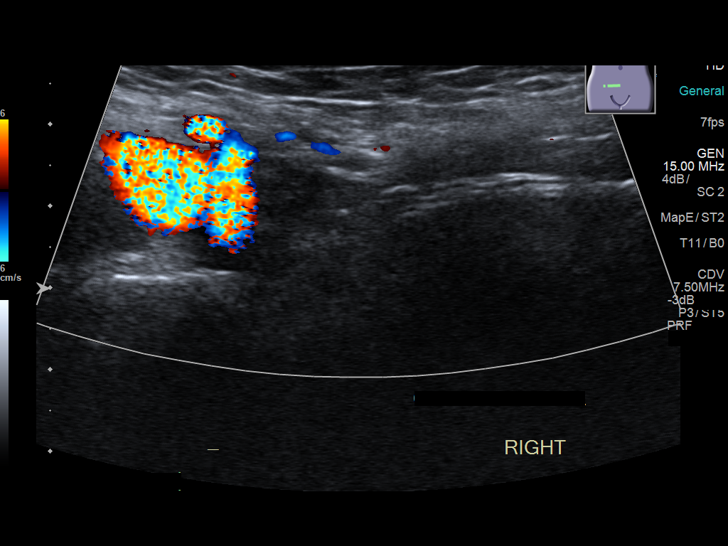

[14 of 22 positions shown; findings below may reference images not displayed]

FINDINGS: Grayscale and Doppler sonographic evaluation of the left inguinal
canal demonstrates no evidence of inguinal hernia, lymphadenopathy
or other abnormalities.
IMPRESSION: No evidence of left inguinal hernia or left inguinal
lymphadenopathy.

## 2020-08-25 ENCOUNTER — Emergency Department: Payer: No Typology Code available for payment source

## 2020-08-25 ENCOUNTER — Emergency Department
Admission: EM | Admit: 2020-08-25 | Discharge: 2020-08-25 | Disposition: A | Discharge status: Home / Self Care | Payer: No Typology Code available for payment source | Source: Home / Self Care | Attending: Emergency Medicine | Admitting: Emergency Medicine

## 2020-08-25 ENCOUNTER — Other Ambulatory Visit: Payer: Self-pay

## 2020-08-25 ENCOUNTER — Encounter: Payer: Self-pay | Admitting: Emergency Medicine

## 2020-08-25 ENCOUNTER — Telehealth: Payer: Self-pay | Admitting: Medical Oncology

## 2020-08-25 ENCOUNTER — Emergency Department
Admission: EM | Admit: 2020-08-25 | Discharge: 2020-08-25 | Discharge status: Against Medical Advice | Payer: No Typology Code available for payment source | Attending: Emergency Medicine | Admitting: Emergency Medicine

## 2020-08-25 DIAGNOSIS — R059 Cough, unspecified: Secondary | ICD-10-CM

## 2020-08-25 DIAGNOSIS — Y9389 Activity, other specified: Secondary | ICD-10-CM | POA: Insufficient documentation

## 2020-08-25 DIAGNOSIS — U071 COVID-19: Secondary | ICD-10-CM | POA: Insufficient documentation

## 2020-08-25 DIAGNOSIS — R509 Fever, unspecified: Secondary | ICD-10-CM

## 2020-08-25 DIAGNOSIS — Y999 Unspecified external cause status: Secondary | ICD-10-CM | POA: Insufficient documentation

## 2020-08-25 DIAGNOSIS — Y92410 Unspecified street and highway as the place of occurrence of the external cause: Secondary | ICD-10-CM | POA: Insufficient documentation

## 2020-08-25 DIAGNOSIS — Z041 Encounter for examination and observation following transport accident: Secondary | ICD-10-CM | POA: Insufficient documentation

## 2020-08-25 DIAGNOSIS — Z20822 Contact with and (suspected) exposure to covid-19: Secondary | ICD-10-CM

## 2020-08-25 LAB — SARS CORONAVIRUS 2 BY RT PCR (HOSPITAL ORDER, PERFORMED IN ~~LOC~~ HOSPITAL LAB): SARS Coronavirus 2: POSITIVE — AB

## 2020-08-25 MED ORDER — NAPROXEN 500 MG PO TABS
500.0000 mg | ORAL_TABLET | Freq: Once | ORAL | Status: AC
  Administered 2020-08-25: 500 mg via ORAL
  Filled 2020-08-25: qty 1
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
## 2020-08-26 ENCOUNTER — Telehealth (HOSPITAL_COMMUNITY): Payer: Self-pay | Admitting: Nurse Practitioner

## 2020-08-26 NOTE — Telephone Encounter (Signed)
Called to Discuss with patient about Covid symptoms and the use of the monoclonal antibody infusion for those with mild to moderate Covid symptoms and at a high risk of hospitalization.     Pt appears to qualify for this infusion due to co-morbid conditions and/or a member of an at-risk group in accordance with the FDA Emergency Use Authorization- BMI 28.    Unable to reach pt by phone. Message sent via MyChart
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
